# Patient Record
Sex: Male | Born: 1985 | Race: White | Hispanic: No | Marital: Single
Health system: Southern US, Community
[De-identification: ages and names within clinical notes are randomized; demographics above are authoritative.]

---

## 2010-05-29 ENCOUNTER — Emergency Department: Payer: Self-pay | Admitting: Emergency Medicine

## 2010-07-09 ENCOUNTER — Emergency Department: Payer: Self-pay | Admitting: Emergency Medicine

## 2010-07-13 ENCOUNTER — Emergency Department: Payer: Self-pay | Admitting: Emergency Medicine

## 2010-08-06 ENCOUNTER — Emergency Department: Payer: Self-pay | Admitting: Emergency Medicine

## 2010-08-12 ENCOUNTER — Emergency Department: Payer: Self-pay | Admitting: Emergency Medicine

## 2010-08-23 ENCOUNTER — Emergency Department: Payer: Self-pay | Admitting: Emergency Medicine

## 2010-09-01 ENCOUNTER — Emergency Department: Payer: Self-pay | Admitting: Emergency Medicine

## 2010-09-13 ENCOUNTER — Emergency Department: Payer: Self-pay | Admitting: Unknown Physician Specialty

## 2010-10-07 ENCOUNTER — Emergency Department: Payer: Self-pay | Admitting: Emergency Medicine

## 2010-10-14 ENCOUNTER — Emergency Department: Payer: Self-pay | Admitting: Emergency Medicine

## 2010-10-21 ENCOUNTER — Inpatient Hospital Stay: Payer: Self-pay | Admitting: Psychiatry

## 2010-11-21 ENCOUNTER — Emergency Department: Payer: Self-pay

## 2010-12-20 ENCOUNTER — Inpatient Hospital Stay: Payer: Self-pay | Admitting: Psychiatry

## 2011-01-16 ENCOUNTER — Inpatient Hospital Stay: Payer: Self-pay | Admitting: Psychiatry

## 2011-02-16 ENCOUNTER — Emergency Department: Payer: Self-pay | Admitting: Emergency Medicine

## 2011-02-18 ENCOUNTER — Inpatient Hospital Stay: Payer: Self-pay | Admitting: Psychiatry

## 2011-02-21 ENCOUNTER — Emergency Department: Payer: Self-pay | Admitting: Emergency Medicine

## 2011-08-16 ENCOUNTER — Emergency Department: Payer: Self-pay | Admitting: Emergency Medicine

## 2011-10-08 ENCOUNTER — Emergency Department: Payer: Self-pay | Admitting: Emergency Medicine

## 2011-10-12 ENCOUNTER — Inpatient Hospital Stay: Payer: Self-pay | Admitting: Psychiatry

## 2011-11-24 ENCOUNTER — Inpatient Hospital Stay: Payer: Self-pay | Admitting: Psychiatry

## 2011-11-24 LAB — COMPREHENSIVE METABOLIC PANEL
Albumin: 4.3 g/dL (ref 3.4–5.0)
Alkaline Phosphatase: 109 U/L (ref 50–136)
Anion Gap: 10 (ref 7–16)
BUN: 10 mg/dL (ref 7–18)
Chloride: 100 mmol/L (ref 98–107)
Co2: 28 mmol/L (ref 21–32)
Creatinine: 1.01 mg/dL (ref 0.60–1.30)
EGFR (African American): >60
EGFR (Non-African Amer.): >60
Glucose: 95 mg/dL (ref 65–99)
Potassium: 3.7 mmol/L (ref 3.5–5.1)
SGOT(AST): 39 U/L — ABNORMAL HIGH (ref 15–37)
SGPT (ALT): 46 U/L
Sodium: 138 mmol/L (ref 136–145)
Total Protein: 7.6 g/dL (ref 6.4–8.2)

## 2011-11-24 LAB — DRUG SCREEN, URINE
Barbiturates, Ur Screen: NEGATIVE (ref ?–200)
Benzodiazepine, Ur Scrn: NEGATIVE (ref ?–200)
Cannabinoid 50 Ng, Ur ~~LOC~~: NEGATIVE (ref ?–50)
MDMA (Ecstasy)Ur Screen: NEGATIVE (ref ?–500)
Methadone, Ur Screen: NEGATIVE (ref ?–300)
Phencyclidine (PCP) Ur S: NEGATIVE (ref ?–25)
Tricyclic, Ur Screen: NEGATIVE (ref ?–1000)

## 2011-11-24 LAB — CBC
HCT: 41.7 % (ref 40.0–52.0)
HGB: 14.2 g/dL (ref 13.0–18.0)
MCH: 31.3 pg (ref 26.0–34.0)
MCHC: 34 g/dL (ref 32.0–36.0)
MCV: 92 fL (ref 80–100)
Platelet: 259 10*3/uL (ref 150–440)
RBC: 4.52 10*6/uL (ref 4.40–5.90)
RDW: 13.1 % (ref 11.5–14.5)
WBC: 9.5 10*3/uL (ref 3.8–10.6)

## 2011-11-24 LAB — TSH: Thyroid Stimulating Horm: 3.28 u[IU]/mL

## 2011-11-24 LAB — ETHANOL
Ethanol %: <0.003 % (ref 0.000–0.080)
Ethanol: <3 mg/dL

## 2011-11-24 LAB — SALICYLATE LEVEL: Salicylates, Serum: <1.7 mg/dL

## 2011-11-24 LAB — CARBAMAZEPINE LEVEL, TOTAL: Carbamazepine: 7.1 ug/mL (ref 4.0–12.0)

## 2012-01-22 ENCOUNTER — Emergency Department: Payer: Self-pay | Admitting: Emergency Medicine

## 2012-01-22 LAB — CBC
HCT: 42.5 % (ref 40.0–52.0)
MCHC: 33.8 g/dL (ref 32.0–36.0)
MCV: 92 fL (ref 80–100)
RDW: 13.1 % (ref 11.5–14.5)

## 2012-01-22 LAB — COMPREHENSIVE METABOLIC PANEL
Anion Gap: 14 (ref 7–16)
Calcium, Total: 9.1 mg/dL (ref 8.5–10.1)
Chloride: 98 mmol/L (ref 98–107)
Co2: 23 mmol/L (ref 21–32)
Creatinine: 1.18 mg/dL (ref 0.60–1.30)
EGFR (African American): >60
EGFR (Non-African Amer.): >60
Potassium: 3.4 mmol/L — ABNORMAL LOW (ref 3.5–5.1)
SGOT(AST): 41 U/L — ABNORMAL HIGH (ref 15–37)
Sodium: 135 mmol/L — ABNORMAL LOW (ref 136–145)
Total Protein: 7.8 g/dL (ref 6.4–8.2)

## 2012-01-22 LAB — ACETAMINOPHEN LEVEL: Acetaminophen: <2 ug/mL

## 2012-01-22 LAB — DRUG SCREEN, URINE
Barbiturates, Ur Screen: NEGATIVE (ref ?–200)
Cannabinoid 50 Ng, Ur ~~LOC~~: NEGATIVE (ref ?–50)
Methadone, Ur Screen: NEGATIVE (ref ?–300)
Opiate, Ur Screen: NEGATIVE (ref ?–300)
Phencyclidine (PCP) Ur S: NEGATIVE (ref ?–25)
Tricyclic, Ur Screen: NEGATIVE (ref ?–1000)

## 2012-03-12 LAB — CBC
MCH: 31.1 pg (ref 26.0–34.0)
Platelet: 272 10*3/uL (ref 150–440)
RBC: 4.94 10*6/uL (ref 4.40–5.90)
WBC: 6.8 10*3/uL (ref 3.8–10.6)

## 2012-03-12 LAB — COMPREHENSIVE METABOLIC PANEL
Albumin: 4.2 g/dL (ref 3.4–5.0)
Calcium, Total: 9.4 mg/dL (ref 8.5–10.1)
Chloride: 104 mmol/L (ref 98–107)
Creatinine: 1.13 mg/dL (ref 0.60–1.30)
EGFR (African American): >60
Glucose: 101 mg/dL — ABNORMAL HIGH (ref 65–99)
Osmolality: 272 (ref 275–301)
SGOT(AST): 32 U/L (ref 15–37)
Sodium: 137 mmol/L (ref 136–145)
Total Protein: 8 g/dL (ref 6.4–8.2)

## 2012-03-12 LAB — DRUG SCREEN, URINE
Amphetamines, Ur Screen: NEGATIVE (ref ?–1000)
Barbiturates, Ur Screen: NEGATIVE (ref ?–200)
Benzodiazepine, Ur Scrn: NEGATIVE (ref ?–200)
Cannabinoid 50 Ng, Ur ~~LOC~~: NEGATIVE (ref ?–50)
MDMA (Ecstasy)Ur Screen: NEGATIVE (ref ?–500)
Methadone, Ur Screen: NEGATIVE (ref ?–300)
Opiate, Ur Screen: NEGATIVE (ref ?–300)
Phencyclidine (PCP) Ur S: NEGATIVE (ref ?–25)

## 2012-03-12 LAB — ETHANOL: Ethanol %: <0.003 % (ref 0.000–0.080)

## 2012-03-12 LAB — TSH: Thyroid Stimulating Horm: 0.492 u[IU]/mL

## 2012-03-12 LAB — ACETAMINOPHEN LEVEL: Acetaminophen: <2 ug/mL

## 2012-03-12 LAB — SALICYLATE LEVEL: Salicylates, Serum: <1.7 mg/dL

## 2012-03-14 ENCOUNTER — Inpatient Hospital Stay: Payer: Self-pay | Admitting: Psychiatry

## 2012-03-14 LAB — LITHIUM LEVEL: Lithium: 0.67 mmol/L

## 2012-03-17 LAB — LITHIUM LEVEL: Lithium: 0.82 mmol/L

## 2012-05-19 LAB — DRUG SCREEN, URINE

## 2012-05-19 LAB — CBC
HCT: 46.8 % (ref 40.0–52.0)
MCH: 30.7 pg (ref 26.0–34.0)
MCV: 91 fL (ref 80–100)
Platelet: 305 10*3/uL (ref 150–440)
RBC: 5.12 10*6/uL (ref 4.40–5.90)
RDW: 13.5 % (ref 11.5–14.5)
WBC: 7.4 10*3/uL (ref 3.8–10.6)

## 2012-05-20 ENCOUNTER — Inpatient Hospital Stay: Payer: Self-pay | Admitting: Psychiatry

## 2012-05-20 LAB — COMPREHENSIVE METABOLIC PANEL
Albumin: 4.7 g/dL (ref 3.4–5.0)
Alkaline Phosphatase: 193 U/L — ABNORMAL HIGH (ref 50–136)
Anion Gap: 9 (ref 7–16)
Calcium, Total: 9.7 mg/dL (ref 8.5–10.1)
Co2: 25 mmol/L (ref 21–32)
Creatinine: 1.23 mg/dL (ref 0.60–1.30)
EGFR (Non-African Amer.): >60
Glucose: 104 mg/dL — ABNORMAL HIGH (ref 65–99)
Osmolality: 274 (ref 275–301)
Potassium: 3.4 mmol/L — ABNORMAL LOW (ref 3.5–5.1)
SGOT(AST): 53 U/L — ABNORMAL HIGH (ref 15–37)
Sodium: 139 mmol/L (ref 136–145)

## 2012-05-20 LAB — CARBAMAZEPINE LEVEL, TOTAL: Carbamazepine: 4.4 ug/mL (ref 4.0–12.0)

## 2012-05-20 LAB — TSH: Thyroid Stimulating Horm: 1.38 u[IU]/mL

## 2012-05-20 LAB — LITHIUM LEVEL: Lithium: 1.15 mmol/L

## 2012-05-20 LAB — SALICYLATE LEVEL: Salicylates, Serum: <1.7 mg/dL

## 2012-05-21 LAB — HEPATIC FUNCTION PANEL A (ARMC)
Alkaline Phosphatase: 173 U/L — ABNORMAL HIGH (ref 50–136)
Bilirubin, Direct: <0.1 mg/dL (ref 0.00–0.20)
SGOT(AST): 43 U/L — ABNORMAL HIGH (ref 15–37)
SGPT (ALT): 76 U/L

## 2012-05-21 LAB — LIPID PANEL
Cholesterol: 168 mg/dL (ref 0–200)
HDL Cholesterol: 29 mg/dL — ABNORMAL LOW (ref 40–60)
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
## 2012-12-03 LAB — CBC
HCT: 44.9 % (ref 40.0–52.0)
HGB: 15.1 g/dL (ref 13.0–18.0)
MCH: 30.5 pg (ref 26.0–34.0)
MCV: 91 fL (ref 80–100)
Platelet: 277 10*3/uL (ref 150–440)

## 2012-12-03 LAB — DRUG SCREEN, URINE
Amphetamines, Ur Screen: NEGATIVE (ref ?–1000)
Benzodiazepine, Ur Scrn: NEGATIVE (ref ?–200)
Cannabinoid 50 Ng, Ur ~~LOC~~: NEGATIVE (ref ?–50)
Cocaine Metabolite,Ur ~~LOC~~: NEGATIVE (ref ?–300)
MDMA (Ecstasy)Ur Screen: NEGATIVE (ref ?–500)
Methadone, Ur Screen: NEGATIVE (ref ?–300)
Opiate, Ur Screen: NEGATIVE (ref ?–300)
Phencyclidine (PCP) Ur S: NEGATIVE (ref ?–25)

## 2012-12-03 LAB — ETHANOL
Ethanol %: <0.003 % (ref 0.000–0.080)
Ethanol: <3 mg/dL

## 2012-12-03 LAB — COMPREHENSIVE METABOLIC PANEL
Alkaline Phosphatase: 149 U/L — ABNORMAL HIGH (ref 50–136)
Chloride: 106 mmol/L (ref 98–107)
Creatinine: 0.86 mg/dL (ref 0.60–1.30)
EGFR (African American): >60
Glucose: 96 mg/dL (ref 65–99)
SGOT(AST): 22 U/L (ref 15–37)
SGPT (ALT): 34 U/L (ref 12–78)
Total Protein: 8 g/dL (ref 6.4–8.2)

## 2012-12-03 LAB — URINALYSIS, COMPLETE
Bilirubin,UR: NEGATIVE
Blood: NEGATIVE
Glucose,UR: NEGATIVE mg/dL (ref 0–75)
Ketone: NEGATIVE
Protein: NEGATIVE
RBC,UR: <1 /HPF (ref 0–5)
Squamous Epithelial: NONE SEEN
WBC UR: <1 /HPF (ref 0–5)

## 2012-12-04 ENCOUNTER — Inpatient Hospital Stay: Payer: Self-pay | Admitting: Psychiatry

## 2012-12-04 LAB — SALICYLATE LEVEL: Salicylates, Serum: <1.7 mg/dL

## 2012-12-04 LAB — ACETAMINOPHEN LEVEL: Acetaminophen: <2 ug/mL

## 2012-12-07 LAB — CARBAMAZEPINE LEVEL, TOTAL: Carbamazepine: 7.1 ug/mL (ref 4.0–12.0)

## 2012-12-07 LAB — LITHIUM LEVEL: Lithium: 0.77 mmol/L

## 2013-01-26 ENCOUNTER — Emergency Department: Payer: Self-pay | Admitting: Emergency Medicine

## 2013-01-26 LAB — DRUG SCREEN, URINE
Barbiturates, Ur Screen: NEGATIVE (ref ?–200)
Cannabinoid 50 Ng, Ur ~~LOC~~: NEGATIVE (ref ?–50)
Cocaine Metabolite,Ur ~~LOC~~: NEGATIVE (ref ?–300)
MDMA (Ecstasy)Ur Screen: NEGATIVE (ref ?–500)
Methadone, Ur Screen: NEGATIVE (ref ?–300)
Opiate, Ur Screen: NEGATIVE (ref ?–300)

## 2013-01-26 LAB — CBC
HCT: 48.2 % (ref 40.0–52.0)
MCH: 30.5 pg (ref 26.0–34.0)
MCHC: 33.3 g/dL (ref 32.0–36.0)
MCV: 91 fL (ref 80–100)
Platelet: 280 10*3/uL (ref 150–440)

## 2013-01-26 LAB — ACETAMINOPHEN LEVEL: Acetaminophen: <2 ug/mL

## 2013-01-26 LAB — URINALYSIS, COMPLETE
Bacteria: NONE SEEN
Leukocyte Esterase: NEGATIVE
Ph: 9 (ref 4.5–8.0)
Protein: NEGATIVE
Specific Gravity: 1.008 (ref 1.003–1.030)
WBC UR: <1 /HPF (ref 0–5)

## 2013-01-26 LAB — COMPREHENSIVE METABOLIC PANEL
Albumin: 4.5 g/dL (ref 3.4–5.0)
Calcium, Total: 9.4 mg/dL (ref 8.5–10.1)
Creatinine: 0.88 mg/dL (ref 0.60–1.30)
EGFR (African American): >60
Glucose: 104 mg/dL — ABNORMAL HIGH (ref 65–99)
Osmolality: 275 (ref 275–301)
SGPT (ALT): 49 U/L (ref 12–78)
Sodium: 138 mmol/L (ref 136–145)
Total Protein: 8.3 g/dL — ABNORMAL HIGH (ref 6.4–8.2)

## 2013-01-26 LAB — SALICYLATE LEVEL: Salicylates, Serum: <1.7 mg/dL

## 2013-01-26 LAB — TSH: Thyroid Stimulating Horm: 0.934 u[IU]/mL

## 2013-01-28 ENCOUNTER — Emergency Department: Payer: Self-pay | Admitting: Emergency Medicine

## 2013-01-28 LAB — URINALYSIS, COMPLETE
Bacteria: NONE SEEN
Bilirubin,UR: NEGATIVE
Blood: NEGATIVE
Glucose,UR: NEGATIVE mg/dL (ref 0–75)
Ketone: NEGATIVE
Leukocyte Esterase: NEGATIVE
Ph: 9 (ref 4.5–8.0)
Protein: NEGATIVE
Specific Gravity: 1.003 (ref 1.003–1.030)
WBC UR: NONE SEEN /HPF (ref 0–5)

## 2013-04-06 IMAGING — CR DG CHEST 2V
1 series · 3 of 3 positions shown · non-contrast
Comparison: none

REASON FOR EXAM: chest pain
COMMENTS:

PROCEDURE:     DXR - DXR CHEST PA (OR AP) AND LATERAL  - February 21, 2011  [DATE]
RESULT:     The lungs are clear. The cardiac silhouette and visualized bony
skeleton are unremarkable.

[Series 1: view not recorded · 0.17mm/px · 3 of 3 slices shown]
[im 1/3]
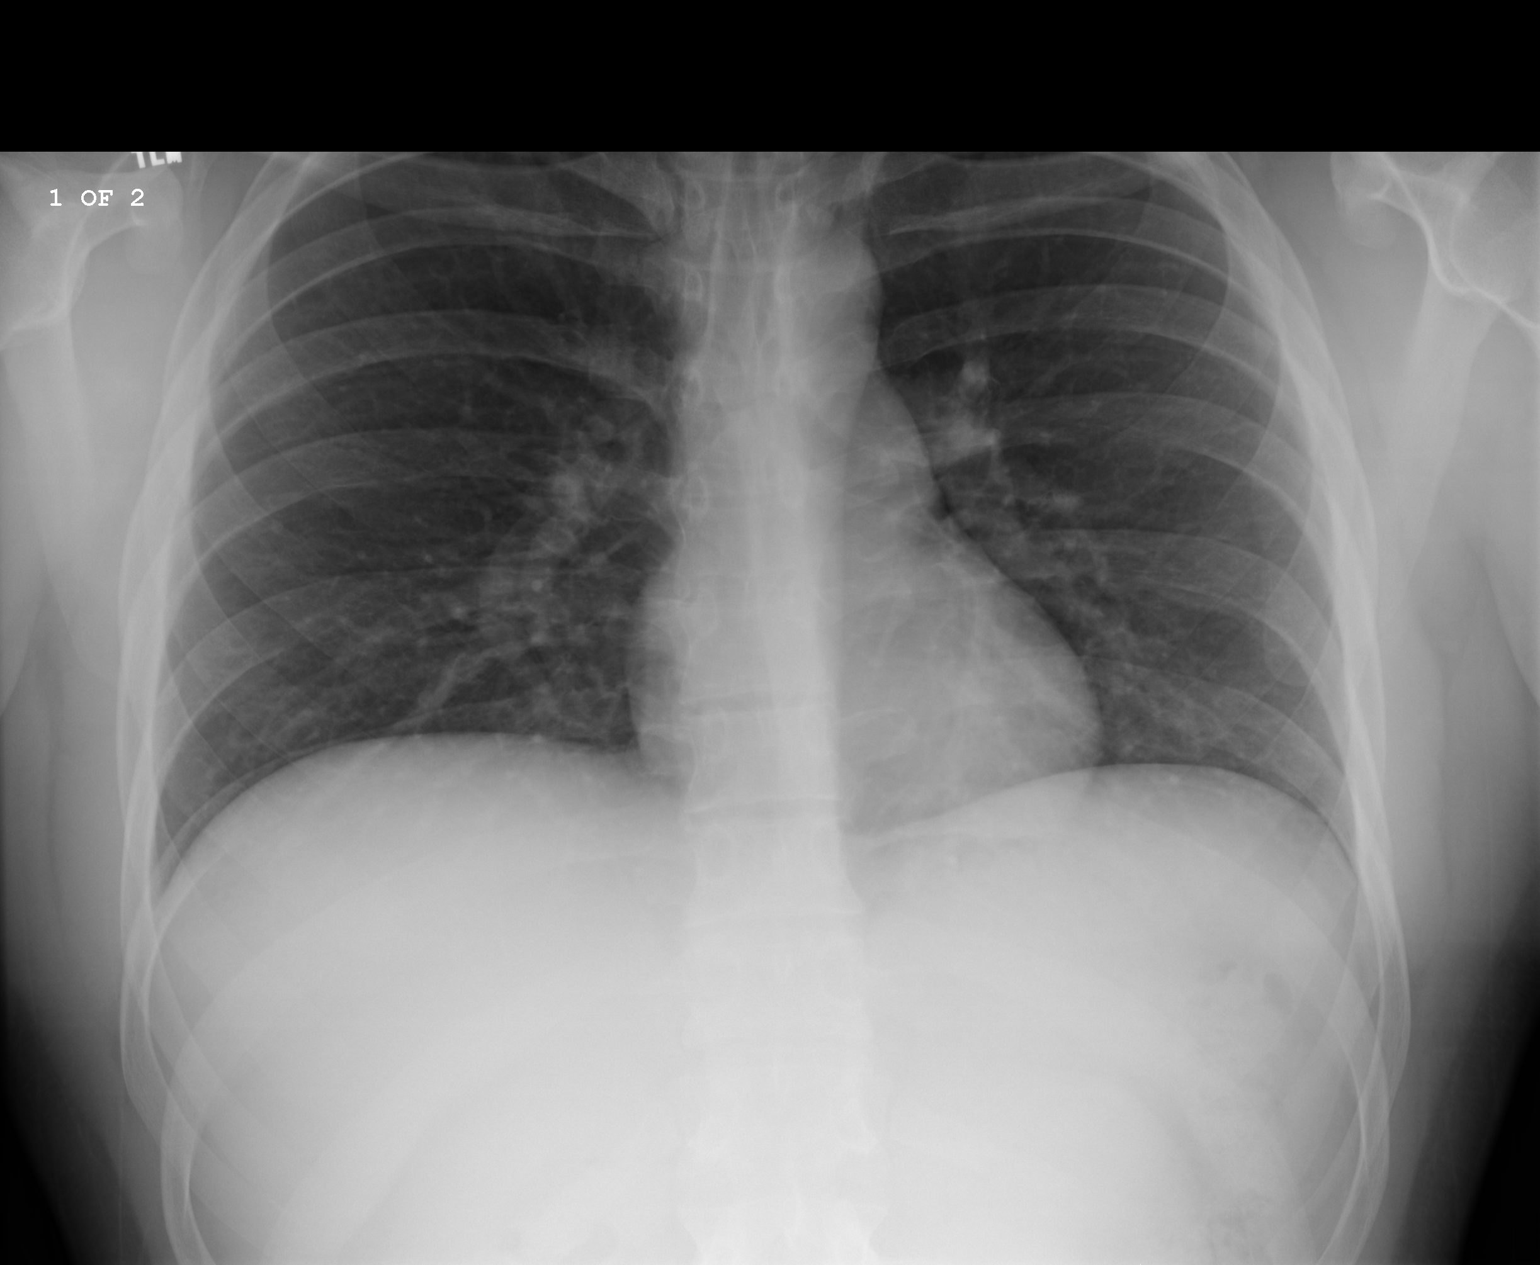
[im 2/3]
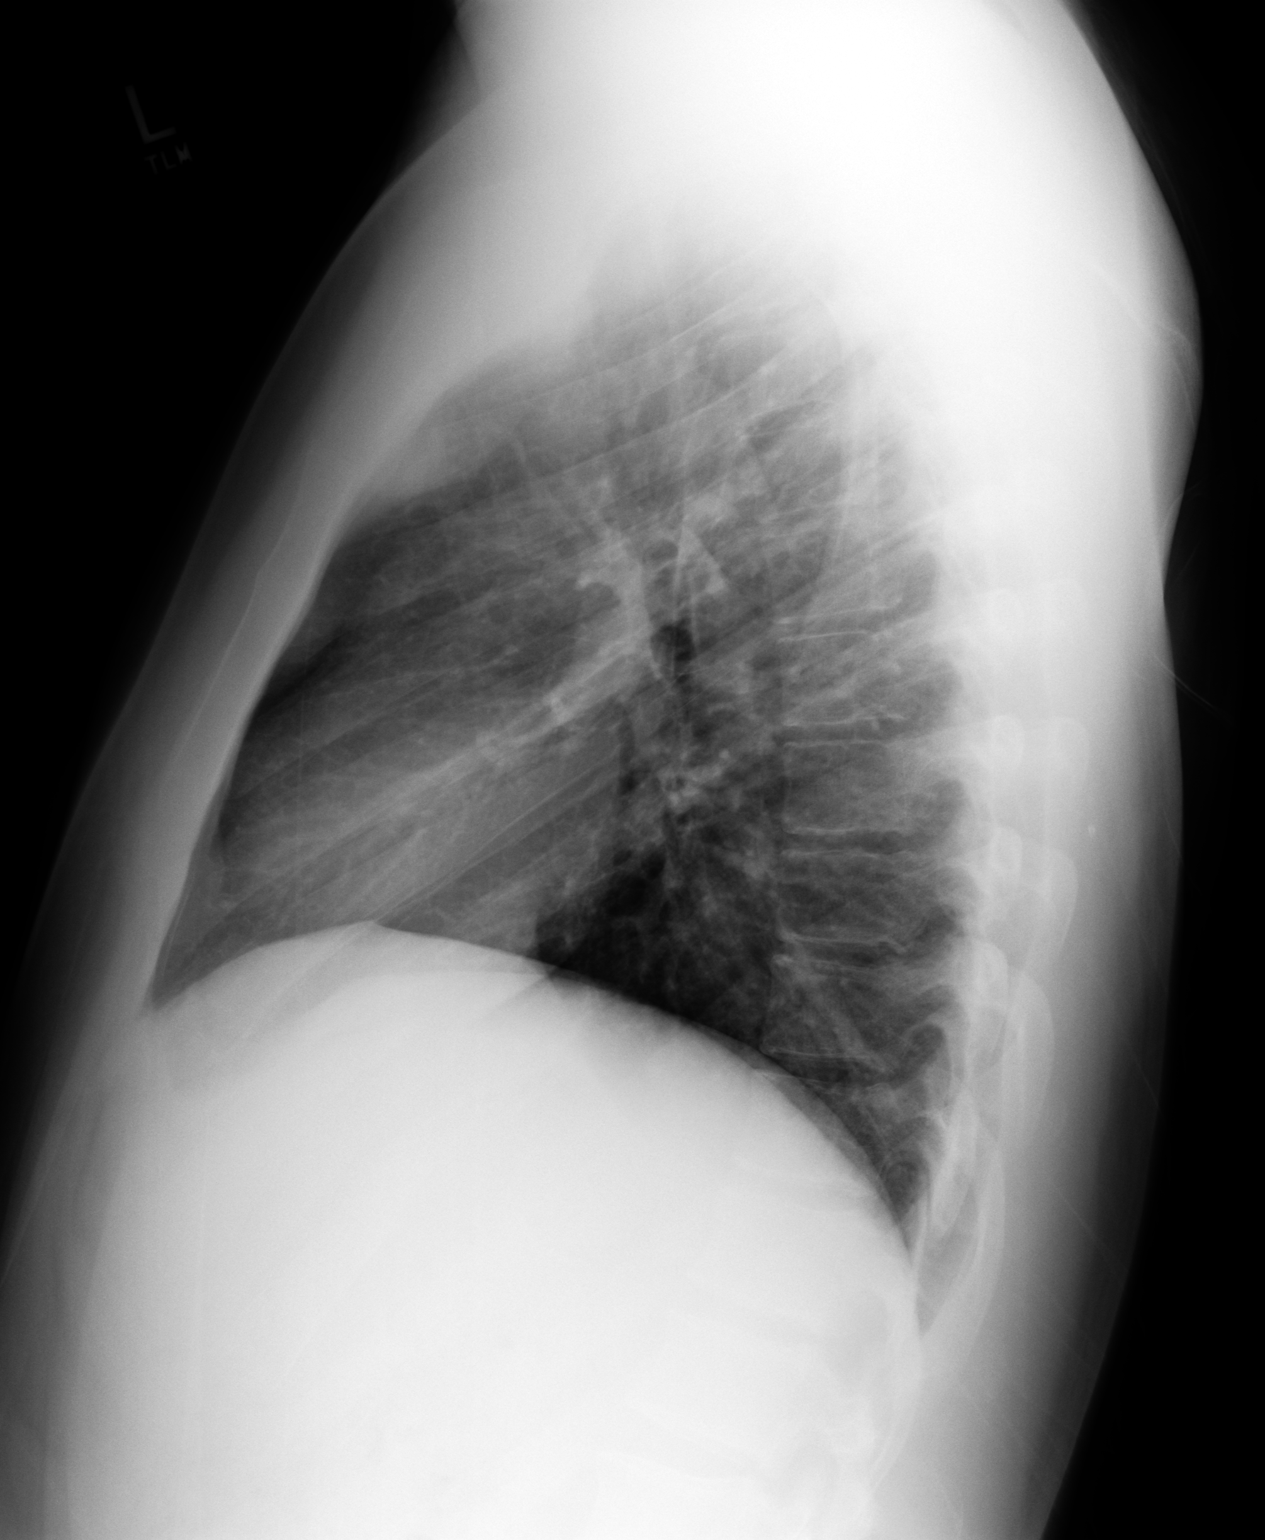
[im 3/3]
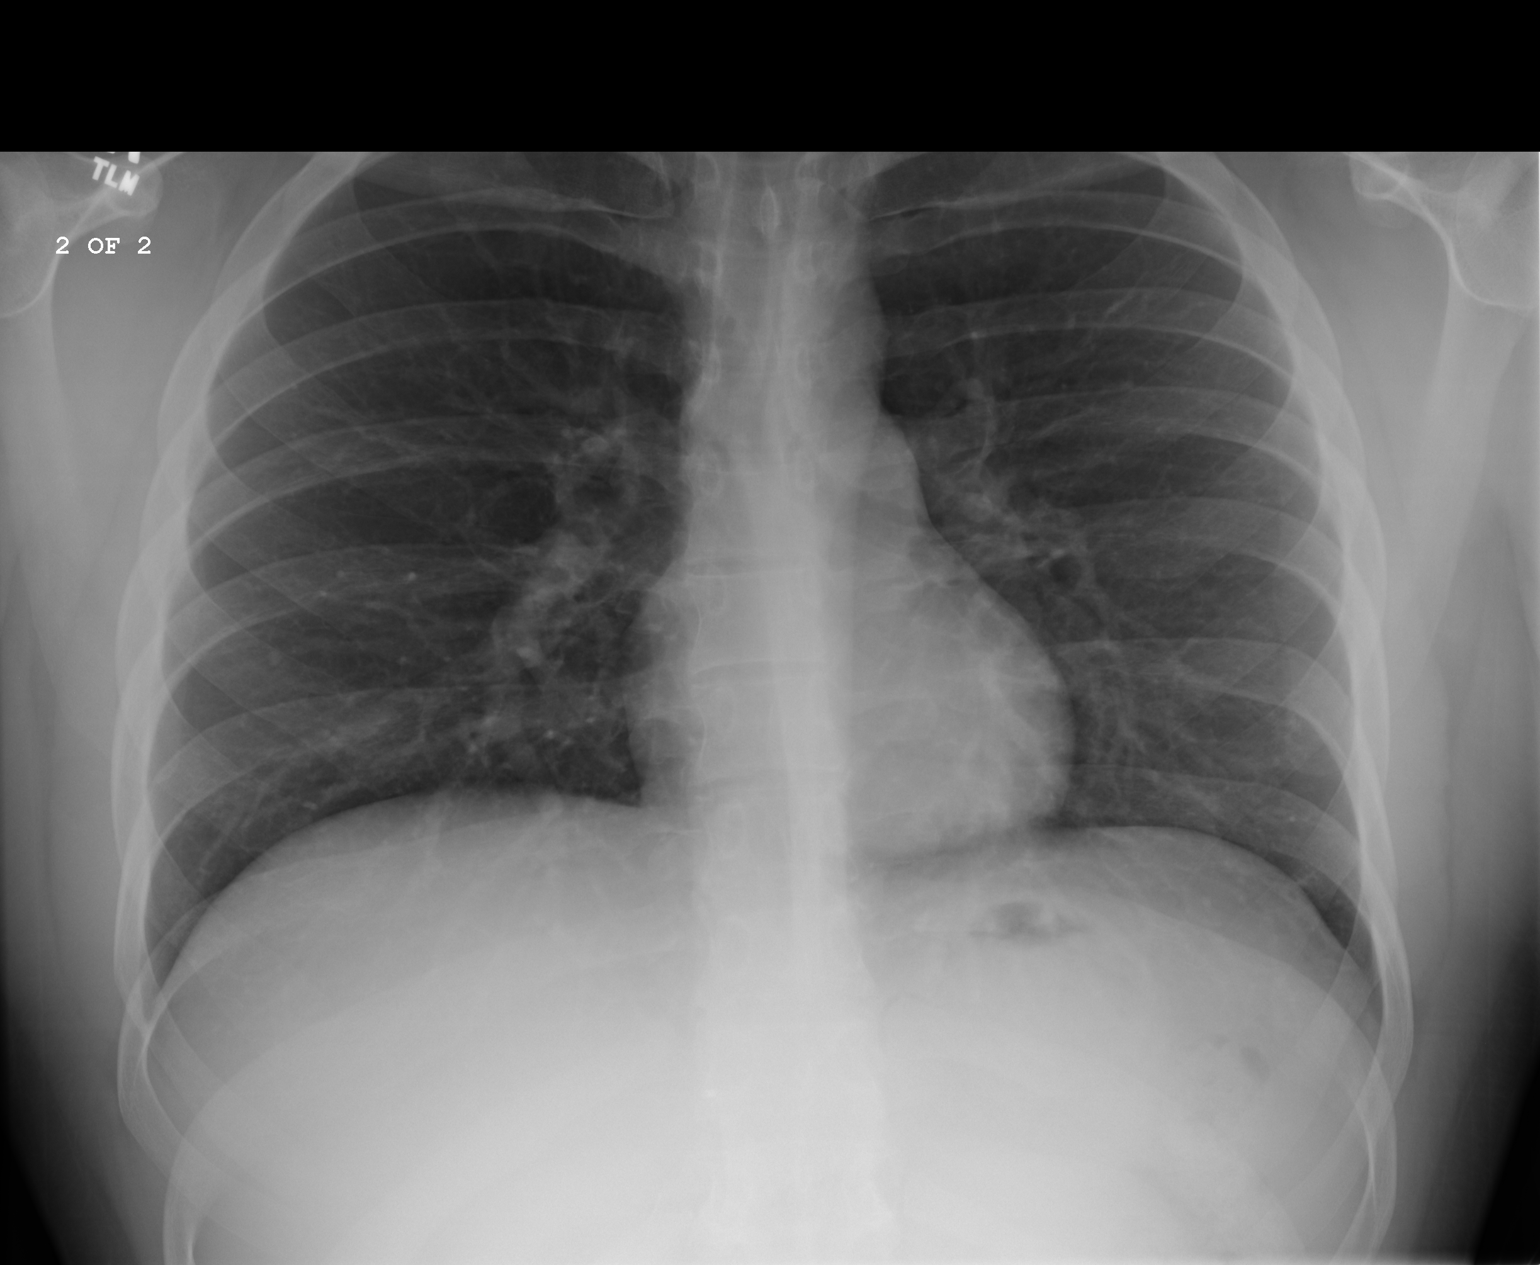

[3 of 3 positions shown; findings below may reference images not displayed]

IMPRESSION: 1. Chest radiograph without evidence of acute cardiopulmonary disease.

## 2013-04-15 LAB — URINALYSIS, COMPLETE
Bacteria: NONE SEEN
Bilirubin,UR: NEGATIVE
Glucose,UR: NEGATIVE mg/dL (ref 0–75)
Leukocyte Esterase: NEGATIVE
Ph: 7 (ref 4.5–8.0)
Specific Gravity: 1.002 (ref 1.003–1.030)

## 2013-04-15 LAB — DRUG SCREEN, URINE
Amphetamines, Ur Screen: NEGATIVE (ref ?–1000)
Benzodiazepine, Ur Scrn: NEGATIVE (ref ?–200)
Cannabinoid 50 Ng, Ur ~~LOC~~: NEGATIVE (ref ?–50)
MDMA (Ecstasy)Ur Screen: NEGATIVE (ref ?–500)
Opiate, Ur Screen: NEGATIVE (ref ?–300)
Tricyclic, Ur Screen: NEGATIVE (ref ?–1000)

## 2013-04-15 LAB — COMPREHENSIVE METABOLIC PANEL
Albumin: 4.1 g/dL (ref 3.4–5.0)
Alkaline Phosphatase: 120 U/L (ref 50–136)
Bilirubin,Total: 0.3 mg/dL (ref 0.2–1.0)
Calcium, Total: 9.2 mg/dL (ref 8.5–10.1)
Co2: 24 mmol/L (ref 21–32)
Creatinine: 0.98 mg/dL (ref 0.60–1.30)
EGFR (Non-African Amer.): >60
Glucose: 94 mg/dL (ref 65–99)
Potassium: 3.6 mmol/L (ref 3.5–5.1)
SGOT(AST): 35 U/L (ref 15–37)
SGPT (ALT): 60 U/L (ref 12–78)
Sodium: 137 mmol/L (ref 136–145)
Total Protein: 7.7 g/dL (ref 6.4–8.2)

## 2013-04-15 LAB — CBC
HCT: 42.4 % (ref 40.0–52.0)
HGB: 14.5 g/dL (ref 13.0–18.0)
MCH: 30.9 pg (ref 26.0–34.0)
MCHC: 34.2 g/dL (ref 32.0–36.0)
MCV: 90 fL (ref 80–100)
RBC: 4.7 10*6/uL (ref 4.40–5.90)
RDW: 13.7 % (ref 11.5–14.5)
WBC: 10.3 10*3/uL (ref 3.8–10.6)

## 2013-04-15 LAB — ETHANOL: Ethanol: <3 mg/dL

## 2013-04-16 ENCOUNTER — Inpatient Hospital Stay: Payer: Self-pay | Admitting: Psychiatry

## 2013-04-21 LAB — CARBAMAZEPINE LEVEL, TOTAL: Carbamazepine: 5.2 ug/mL (ref 4.0–12.0)

## 2015-03-09 NOTE — Discharge Summary (Signed)
PATIENT NAME:  [name], [name] MR#:  [id number] DATE OF BIRTH:  [birth year]  DATE OF ADMISSION:  12/04/2012 DATE OF DISCHARGE:  12/07/2012   HOSPITAL COURSE: See dictated history and physical for details of admission. This 29 year old man with a long history of schizoaffective disorder came to the hospital reporting vaguely that he was having suicidal and homicidal ideation. He felt like his mood was more labile. The reasons he gave me were fairly nonspecific. He said that he was feeling bad about not having much family support. In the hospital, he was continued on his usual medications as prescribed and was involved in groups and activities. The patient was only intermittently cooperative with groups, but did take his medicine. He did not display any violent, dangerous or self-injuring behavior. At the time of discharge, he has reported for a full day that he is having no thoughts at all about hurting himself. He is completely agreeable to continuing on his medication. Completely agreeable to continuing with outpatient treatment as prescribed in the community. His thoughts are fairly lucid. Not talking actively about any bizarre content. Not showing any racing thoughts or bizarre agitated behavior. The patient has been educated about the importance of staying compliant with his medication, talking to his outpatient providers regularly about his mood to give them feedback about how he is feeling and keeping on a regular sleep schedule. He is agreeable to all of that. He has been sleeping much better since coming into the hospital.   DISPOSITION: The patient is discharged back to [facility], his group home, and will follow up with [facility].   LABORATORY RESULTS: Admission labs showed a negative drug screen. TSH normal at 0.82. Alcohol undetectable. Chemistry showed just a slightly elevated alkaline phosphatase at 149. CBC was entirely normal. Urinalysis unremarkable. Salicylates and  acetaminophen negative. Levels of lithium and Tegretol were not drawn at admission. I am going to check those before he is discharged.   DISCHARGE MEDICATIONS:  1. Docusate 200 mg b.i.d.  2. Seroquel 800 mg at bedtime.  3. Prilosec 20 mg in the morning.  4. Mevacor 10 mg at night.  5. Ativan 1 mg t.i.d. p.r.n. for anxiety.  6. Lithium 600 mg 3 times a day.  7. Tegretol extended release 200 mg twice a day.  8. Cogentin 2 mg twice a day.   MENTAL STATUS EXAM AT DISCHARGE: Slightly disheveled young man who looks his stated age, who was cooperative with the interview. Good eye contact, normal psychomotor activity. Not fidgety or agitated. Speech is normal in rate, tone and volume. Affect smiling, upbeat, optimistic. Mood stated as being good. Thoughts are lucid with no obvious loosening of associations or bizarre content. Denies auditory or visual hallucinations. Denies suicidal or homicidal ideation. Shows good insight and judgment. Baseline intelligence normal, alert and oriented x4.   DIAGNOSIS PRINCIPLE AND PRIMARY:  AXIS I: Schizoaffective, bipolar type.   SECONDARY DIAGNOSES:  AXIS I: No further.  AXIS II: Deferred.  AXIS III: Dyslipidemia, gastric reflux symptoms, looks like he is losing some teeth which might be related to his lithium.  AXIS IV: Moderate to severe, chronic, from lack of family support.  AXIS V: Functioning at time of discharge 57.    ____________________________ Audery AmelJohn T. Clapacs, MD jtc:OSi D: 12/07/2012 11:42:58 ET T: 12/07/2012 12:40:02 ET JOB#: [id number]  cc: Audery AmelJohn T. Clapacs, MD, <Dictator> Audery AmelJOHN T CLAPACS MD ELECTRONICALLY SIGNED 12/09/2012 11:51
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
## 2015-03-09 NOTE — H&P (Signed)
PATIENT NAME:  [name], [name] MR#:  [id number] DATE OF BIRTH:  1986/07/06  DATE OF ADMISSION:  12/04/2012  SEX:  Male RACE:  White AGE:  29 year  INITIAL ASSESSMENT AND PSYCHIATRIC EVALUATION   IDENTIFYING INFORMATION: This is a 29 year old Hispanic male who is not employed, never worked and never married and has been living at group homes all his life and currently lives at [facility] group home.    HISTORY OF PRESENT ILLNESS: The patient [facility] after being discharged in July 2013 with a chief complaint of "last night I felt stressed out and started having homicidal ideas, trying to hurt others".  According to information obtained from the chart, the patient had been having suicidal and homicidal ideas last night and so he was brought to the hospital.  The patient reports he was stressed out because he had a conflict with the manager of the group home recently and this has been in his mind.  PAST PSYCHIATRIC HISTORY:  The patient has a long history of mental illness with multiple inpatient stays on psychiatry.  The patient reports that he has been in and out of group homes all of his life.  He had multiple inpatient admissions [facility] in the past.  He had been an inpatient at various other psychiatric facilities.  No history of previous suicide attempts though he had suicide ideas.  [facility] by Dr. Mervyn SkeetersA. Last appointment was in December 2013, next appointment will be made soon.  The patient was discharged on the following medications in July 2013:   1.  Lorazepam 1 mg 3 times a day. 2.  Lithium 100 mg  2 tablets 3 times a day. 3.  Carbamazepine 200 mg twice a day. 4.  Asenapine 20 mg once a day. 5.  Lovastatin 10 mg once a day. 6.  Docusate sodium 100 mg twice a day. 7.  Quetiapine extended-release 800 mg once a day. 8.  Cogentin 2 mg twice a day. 9.  Ibuprofen 600 mg p.o. q.6 hours p.r.n. 10.  Invega Sustenna 234  mg IM.   The patient is taking all of the medications as prescribed.  FAMILY HISTORY OF MENTAL ILLNESS:  The patient was adopted so no known history of suicide in the family.  He is in touch with his adopted parents.   ALCOHOL AND DRUG USE:  The patient does report a history of alcohol dependence between the ages of [age] and [age].  Currently, he drinks rarely on occasions.  He denies drinking alcohol on a regular basis at this time. He denies any street or prescription drug abuse.  He does admit to smoking, 1 to 2 packs a day for many years.    PAST MEDICAL HISTORY:  Hyperlipidemia and GERD.  No known history of high blood pressure no known history of diabetes mellitus.  No major surgeries.  No major injuries.  ALLERGIES:  No known drug allergies.  He is being followed by Dr. Marguerita MerlesJadla.  His last appointment was 2 weeks ago.  His next appointment is coming up in February 2014.    MILITARY HISTORY:  None.    WORK HISTORY:  Never.  PERSONAL HISTORY:  The patient was adopted.  He did not graduate from high school.  He dropped out in 10th grade.  He did not want to go back to the school.  No GED.  Never worked so far.  Never married.  dated  quite a few girls.  No children.  PHYSICAL EXAMINATION: VITAL SIGNS:  Temperature 98.2, pulse 80 per minute and regular, respirations 18, blood pressure 120/78 mmHg. HEENT: Head is normocephalic, atraumatic. Eyes PERRLA. Fundi bilaterally benign. EOMs visualized. Tympanic membranes have no exudate.  NECK: Supple without any organomegaly, lymphadenopathy or thyromegaly.  CHEST: Normal expansion. Normal breath sounds. HEART: Normal S1 and S2 without any murmurs or gallops. ABDOMEN: Soft. No organomegaly. Bowel sounds heard.  RECTAL: Deferred. NEUROLOGIC: Gait is nm.  Romberg is negative. Cranial nerves II through XII are grossly intact. DTRs are 2+. Plantars have normal response.  MENTAL STATUS EXAMINATION:  The patient is dressed in street clothes.  He is fully  alert and oriented to place, person and time.  He knew it was Saturday and he knew the date.  Speech was soft and slow and fluent and coherent.  Denies feeling depressed, denies feeling hopeless or helpless.  He realizes that he had suicidal and homicidal thoughts at the time of admission because he had conflicts with the manager and he was upset about the same.  Denies auditory or visual hallucinations.  Denies paranoid or suspicious ideas. He could 2 of the 3 recall of all the things we asked him to remember.  He could name the current president and the previous president.  He had difficulty with spelling and he said he always had difficulty with spelling.  He could not do serial 7s but he could do serial 3s with help.  On abstracting, the patient was concrete.  He does admit to appetite and sleep disturbance.   He denies any ideas or plans to hurt himself or others and he said "not now, I did probably when I came in".  Insight and judgment are guarded.   IMPRESSION:  AXIS I:    Bipolar disorder with history of psychotic features and manic episodes, last episode                  depressed.                    History of alcohol abuse/dependence in partial remission.                  Nicotine dependence. AXIS II:   Deferred. AXIS III:  Obesity.                   Hyperlipidemia.                   Chronic constipation.                   History of gastroesophageal reflux disease. AXIS IV:  Severe - long history of mental illness and had been at various group homes                   since childhood. AXIS V:   GAF at the time of admission 25.  PLAN: The patient is admitted to [facility] for close observation, management and help. He will be started back on all of his medications. During the stay in the hospital, he will be given milieu therapy and supportive counseling.  Coping skills in dealing with anger issues and getting along with other people will be discussed.  At the time of  discharge, he will be stabilised  and appropriate followup appointment will be made in the community.   ____________________________ Jannet Mantis. Guss Bunde, MD skc:ct D: 12/04/2012 19:07:18 ET T: 12/05/2012  07:23:36 ET JOB#: [id number]  cc: Trevis Eden K. Guss Bunde, MD, <Dictator> Beau Fanny MD ELECTRONICALLY SIGNED 12/05/2012 17:58
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
## 2015-03-11 NOTE — Consult Note (Signed)
Brief Consult Note: Diagnosis: bipolar disorder.   Patient was seen by consultant.   Consult note dictated.   Discussed with Attending MD.   Comments: Psychiatry: Patient seen. 29 yo well known from prior admits with hx bipolar disorder. Last night became agitated and loud and threatening at group home. Today he is calmed down and lucid and denies any suicidal or homicidal ideation. Denies any psychosis. Appears to be at his baseline mental state. Patient is on a great deal of medication already and it is not clear that adding anything is going to acutely change anything. Spoke with group home administrater who is ok with return home and he has outpatient follow up arranged.  Electronic Signatures: Audery Amellapacs, John T (MD)  (Signed 08-Mar-13 12:36)  Authored: Brief Consult Note   Last Updated: 08-Mar-13 12:36 by Audery Amellapacs, John T (MD)

## 2015-03-11 NOTE — Consult Note (Signed)
PATIENT NAME:  [name], [name] MR#:  [id number] DATE OF BIRTH:  01-17-1986  DATE OF CONSULTATION:  01/23/2012  REFERRING PHYSICIAN:   CONSULTING PHYSICIAN:  Audery AmelJohn T. Clapacs, MD  IDENTIFYING INFORMATION AND REASON FOR CONSULT: 29 year old man with a history of bipolar disorder brought in to the Emergency Room from his group home because of agitated, threatening behavior. Consult for evaluation of appropriate treatment and disposition.   HISTORY OF PRESENT ILLNESS: Information obtained from the patient and the chart and from speaking with the group home administrator. Patient and the group home administrator agree that yesterday evening he became acutely agitated. He started yelling and waving his arms around. Shoved some belongings off of a table. Was cursing. Appeared to be threatening to others. Group home felt like he was acutely dangerous and needed to call the police. Patient tells me that he got angry yesterday after going to church. One reason he gives for it is that he consumed eight cups of coffee at church although the group home manager thinks that there have been episodes like this in the past without excessive caffeine. Patient tells me that overall he does not notice any acute new stressors to his life. He did say that he was thinking about hurting "women". When I asked him today he totally denied any intention or wish to harm anyone particularly any women. Denied any suicidal ideation. He denies any current or recent hallucinations or paranoia or other psychotic symptoms. He says that there are things that get on his nerves regularly at the home that he is living in. He has been compliant with his medication. Recently there has been a change in his outpatient psychiatry provider. Something about his Medicaid being from the wrong county is requiring him to switch to a new provider and that has not been finalized yet.   PAST PSYCHIATRIC HISTORY: Long history of schizoaffective or bipolar disorder.  Multiple admissions in the past. Patient is known to me from prior admissions. Recently it appears that he has had episodes that are similar to this. He will become acutely agitated and lose his temper at his group home. It is not always clear what it is about. Usually without any particular change to his medication he calms down quickly in the hospital. He tends to respond very well to any show of force and definite limit setting. He tends to be a little harder to control and my experience when there aren't clear limits set with him. He has been on a great many medications in the past without anyone or even combination clearly making a complete difference. Current list of medicines includes three different antipsychotics and two separate mood stabilizers as well as tranquilizers. It appears that he has gotten a shot and has been compliant with all of this. [name] does have a past history of some aggression previously and has a past history of some suicidal behavior not in the immediately recent past.   PAST MEDICAL HISTORY: Patient has elevated cholesterol. He has gained some weight recently. Otherwise no significant ongoing medical problems.   SOCIAL HISTORY: Patient is disabled, lives in group homes. As mentioned previously, he tends to respond best to clear limits and scheduling and having things to do with his time. He has had some difficult relations with his family in the past. I believe at times the family seemed to be more disruptive to his emotional state. He is invested in his church attendance and likes to be social and have friends.  CURRENT MEDICATIONS:  1. Seroquel 800 mg at night.  2. Trazodone 200 mg at night.  3. Saphris 20 mg at night.  4. Lithium citrate 900 mg twice a day.  5. Ativan 1 mg 3 times a day p.r.n. which apparently is now being given pretty much on a standing basis. 6. Hinda Glatter Sustenna 234 mg every few weeks. 7. Lovastatin 10 mg at night.  8. Tegretol 200 mg twice a  day. 9. Cogentin 2 mg at night.   REVIEW OF SYSTEMS: Patient currently states that his mood is okay. Denies any suicidal or homicidal ideation. Denies any psychotic symptoms. Denies feeling anxious. Does not have any specific physical complaints.   MENTAL STATUS EXAM: Patient interviewed in a hospital room. He was cooperative and appropriate for the most part. Eye contact was adequate. Psychomotor activity normal. Speech normal rate, tone, and volume. Affect a little constricted. Mood stated as okay. Thoughts simplistic and a little bit slow, although he is not mentally retarded. Not disorganized or bizarre. Denies paranoia. Denies any hallucinations or delusions. He denies any suicidal or homicidal ideation. He has been calm and cooperative with treatment since being here in the Emergency Room. Patient states that he knows that he was out of line to lose his temper yesterday and regrets that and is going to work on trying to do better when he goes home.   ASSESSMENT: 29 year old gentleman with schizoaffective or bipolar disorder. In my experience with him I have also suspected there may be some degree of developmental disorder and/or personality disorder with him. Medications have been only partially helpful for him despite being on a very large amount of medication now. Patient lost his temper yesterday and was agitated but did not actually hurt anyone. Today he has calmed down to his normal mental state. Not agitated, hostile or threatening. At this point there does not seem to be any clear indication for change in his medication and he does not appear to be in acute danger to himself or others.   TREATMENT PLAN: I recommend that he be released back to his group home. I have talked with her group home manager and they are agreeable to that. No change to his medicine. We will make sure that we have appointments set up for follow up outpatient treatment for him in the near future. Patient agrees to this as  well. Supportive therapy and counseling done with the patient particularly emphasizing the importance of his working on controlling his temper, finding alternate ways to deal with his stress.   DIAGNOSIS PRINCIPLE AND PRIMARY:  AXIS I: Schizoaffective disorder, bipolar type.   SECONDARY DIAGNOSES:  AXIS I: No further.   AXIS II: Deferred.   AXIS III: Obesity, hyperlipidemia.   AXIS IV: Chronic severe stress from his chronic mental illness, living in group homes, limited social contact.   AXIS V: Functioning at time of discharge and evaluation 52.  ____________________________ Audery Amel, MD jtc:cms D: 01/23/2012 12:46:28 ET T: 01/23/2012 13:09:15 ET JOB#: [id number]  cc: Audery Amel, MD, <Dictator> Audery Amel MD ELECTRONICALLY SIGNED 01/23/2012 14:05

## 2015-03-11 NOTE — H&P (Signed)
PATIENT NAME:  [name], Audel MR#:  [id number] DATE OF BIRTH:  1986/05/21  DATE OF ADMISSION:  03/13/2012  INITIAL ASSESSMENT AND PSYCHIATRIC EVALUATION  IDENTIFYING INFORMATION: [name] is a 29 year old Hispanic male who has been living at a group home [facility]. Has been living there for one year. Patient was brought to the Emergency Room at [facility] because of behavioral problems.   CHIEF COMPLAINT: "I told my roommate I am going to suffocate him to death."  HISTORY OF PRESENT ILLNESS: Patient has been living at the same group home for one year. Patient reports the group home is full and there is no other place for him except that he has to share the same room with a roommate. He got upset and got into argument and he told that he was going to suffocate the roommate to death and he reports that he felt that he meant it at that time but not now.   PAST PSYCHIATRIC HISTORY: Patient has several inpatient hospitalizations in psychiatry. He has been in the hospital in psychiatry since age 89. He is one of the most difficult patients at [facility]. He reports that he has been taking his medications and being compliant with the medications. He was last discharged from [facility] on 11/26/2011. He was on following medications: Saphris 20 mg at bedtime, Cogentin 2 mg daily, Tegretol 200 mg b.i.d., Lithium 600 mg t.i.d., Ativan 1 mg p.o. t.i.d. p.r.n. as needed for agitation, Seroquel SR 800 mg at bedtime, Gean [place]ch[place]vega Sustenna injection 234 mg IM once a month, lovastatin 10 mg at bedtime, Colace 100 mg p.o. b.i.d. Patient reports he is compliant and takes his medications as prescribed.  FAMILY HISTORY OF MENTAL ILLNESS: Patient was adopted.   PSYCHIATRIST: Patient has appointment coming up this coming Tuesday with his psychiatrist from the group home.   SOCIAL HISTORY: Patient was adopted. [place] up in [place]. Has been in and out of group homes for many years. [place]  [place]. Has been in foster care and group homes since age 25 years. Dropped out in 9th grade because he kept stalking the girls and expelled from the school. No GED.   WORK HISTORY: Never worked so far.   MARRIAGES: Never married. No children.   ALCOHOL AND DRUGS: Denies street or prescription drug abuse or alcohol drinking and he reports "Not anymore. Not had alcohol or drugs in the past one year." Used to smoke cigarettes but he quit smoking three months ago.   PAST MEDICAL HISTORY: No known history of high blood pressure. No known diabetes mellitus. No major surgeries. No major injuries. No history of motor vehicle accident, never been unconscious. History of constipation. History of dyslipidemia.   ALLERGIES: No known drug allergies.  PRIMARY CARE PHYSICIAN: Has appointment coming up with the medical doctor soon.   PHYSICAL EXAMINATION:  VITAL SIGNS: Blood pressure 140/86 mmHg, pulse 78 per minute regular, respirations 18 per minute regular, temperature afebrile.  HEENT: Head is normocephalic, atraumatic. Pupils are equal, round, and reactive to light and accommodation. Fundi bilaterally benign.   NECK: Supple without any organomegaly, thyromegaly.   LUNGS: Clear to auscultation. No dullness to percussion.   HEART: Normal S1, S2 without any murmurs or gallops.  ABDOMEN: Soft. No organomegaly. Bowel sounds heard.   MUSCULOSKELETAL: Normal strength in all extremities.   SKIN: No rashes or bruises.   LYMPHATIC: No cervical lymphadenopathy.   RECTAL: Deferred.   NEUROLOGICAL: Gait is  normal. Romberg is negative. Cranial nerves II through XII grossly intact and normal. DTRs 2+. Plantars are normal response.  MENTAL STATUS EXAMINATION: Patient is dressed in street clothes. Alert and oriented and fully aware of situation that brought him for admission to the hospital. He is calm, pleasant and cooperative with the undersigned. He denies feeling depressed.  Denies feeling hopeless or helpless. Denies auditory or visual hallucinations. Does not appear to be responding to any internal stimuli. He reports that he did mean to suffocate the roommate to death at that time but not anymore. He feels stable enough and he is ready to go back. He did have command hallucinations in the past to hurt people but is not hearing any voices now. Cognition is grossly intact. Insight and judgement are guarded.   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    AXIS II: Deferred.  AXIS III:  1. Obesity, mild. 2. Dyslipidemia.  3. History of constipation.  AXIS IV: Severe. Long history of mental illness, problems with primary support.   AXIS V: Global Assessment of Functioning at admission 25.  PLAN: Patient admitted to [facility] for close observation, evaluation and help. He will be started back on all of the above named medications. During the stay in the hospital he will be given milieu therapy and supportive counseling. He will take part in individual and group therapy when he is ready to do so. Constant redirection for his behavior needs to be done where ever he lives because of his behavioral issues and problems. Patient will be discharged back to same group home after he is stabilized.   ____________________________ Jannet Mantis. Guss Bunde, MD skc:cms D: 03/14/2012 18:42:00 ET T: 03/15/2012 06:16:48 ET  JOB#: [id number] cc: Monika Salk K. Guss Bunde, MD, <Dictator> Beau Fanny MD ELECTRONICALLY SIGNED 03/20/2012 18:31

## 2015-03-11 NOTE — Consult Note (Signed)
Brief Consult Note: Diagnosis: Schizoaffective disorder bipolar type.   Patient was seen by consultant.   Consult note dictated.   Recommend further assessment or treatment.   Orders entered.   Discussed with Attending MD.   Comments: [name] became increasingly psychotic and insomniac in spite of treatment compliance. He is not suicidal or homicidal. There are no command auditory hallucinations.  The patient relocated to a new group home lately. He missed his psychiatry appointment last Thursday. He reportedly was given [place]vega sustenna shot at the end of December.  PLAN: 1. Psychosis: He is to continue [place]vega sustenna injections, next on January 23. He will continue on Seroquel and Saphris. We will switch extended release Seroquel to immediate release to facilitate sleep. He will continue on Cogentin 2 mg in am for EPS.  2.Mood: He is to continue Lithium and Tegretol. Both levels are therapeutic.  3. Insomnia: Trazodone was discontinued at his last appointment. Wil restart Trazodone.  4. Agitation: He should be offered as needed Ativan at the group home as prescribed by his psychiatrist. He is negative for benzodiazepines.   5. He will [facility]  Electronic Signatures: Kristine LineaPucilowska, Theresia Pree (MD)  (Signed 07-Jan-13 13:19)  Authored: Brief Consult Note   Last Updated: 07-Jan-13 13:19 by Kristine LineaPucilowska, Keera Altidor (MD)

## 2015-03-11 NOTE — H&P (Signed)
PATIENT NAME:  [name], [name] MR#:  [id number] DATE OF BIRTH:  05-24-1986  DATE OF ADMISSION:  11/24/2011  REFERRING PHYSICIAN: Dr. Beaulah Dinning.   ATTENDING PHYSICIAN: Orson Slick, M.D.   IDENTIFYING DATA: [name] is a 29 year old male with history of bipolar illness.   CHIEF COMPLAINT: I hear voices.   HISTORY OF PRESENT ILLNESS: [name] has been hospitalized several times at [facility] since he came to our area in 2011. His last hospitalization was in November 2012. He has been stable on his medications of lithium, Tegretol, Seroquel, Saphris and Invega. He takes medications with enthusiasm and in spite of good treatment compliance, he experiences periods of hypomania that are not well controlled with the current regimen in spite of multiple mood stabilizers at adequate doses. He reports that for the past 2 to 3 weeks, he felt restless. It usually manifests itself with excessive walking. He has been walking in the neighborhood for hours, but he did not find it calming. He thinks that what precipitated the current admission was a stressful situation at church. He participated in some youth group meetings. They were talking about pornography and child pornography specifically. [name] reportedly disclosed to his group leader that he was in prison for molesting a minor. I am not really certain if this is true. As far as we know, he has no criminal record. As a result, [name] was told that he will not be able to participate in any activities involving children in church. He was asked if it was okay with him and he said yes, but when returned home he was unsettled, very angry and started hallucinating. His psychosis oftentimes has sexual undertone. Sometimes he thinks of women from church. I never heard him mentioning children. Sexual thoughts are rather upsetting to him and he decided to come to the hospital. He is not suicidal or homicidal. I do not think that he wants to  act out, but we decided to admit him to the hospital, especially that he works with new Printmaker. This is because his group home was bought and sold. Also, he will no longer work with a Designer, jewellery at [employer] and he met his new psychiatrist there, Dr. [name]. He, however, did miss an appointment with him four or five days ago on Thursday. Apparently, new group owner was unaware. During his last visit with Dr. [name] his trazodone was discontinued. It is hard to say whether it contributed to insomnia, but the patient experiences profound insomnia, racing thoughts, psychotic symptoms and hyperactivity. He is on so many different medications that it is obvious that his providers question the need for them all. Of course, it is hard to answer this question, but I remember when we admitted him the first time, his major reason for injectable Kirt Boys was the fact that he was known to disappear from group homes for days on end during his frequent hypomanic episodes and, therefore, was without medication for stretches of time. He never opposes taking medicines and really wants to get better, therefore, it is very easy to prescribe medication for him. I do believe that there is a benefit of this polypharmacotherapy. He, however, did gain a lot of weight in the past year and a half, but he is much calmer and when he comes to the hospital, he is very close to his baseline so it is easy to discharge him home. The best evidence is that the patient stayed at the same group  home since April of 2012 while before he was changing his locations very, very frequently. [name] denies any substance use or alcohol.   PAST PSYCHIATRIC HISTORY: He has had multiple psychiatric hospitalizations since the age of [age]. He is a very difficult patient. He apparently has been stable on five mood stabilizers including lithium, Tegretol, Seroquel, [place] and Saphris. There is no history of suicide attempts. There is  no history of violence or substance use. When upset and psychotic he sometimes threatens to kill people, but he has never been aggressive towards anyone.   FAMILY PSYCHIATRIC HISTORY: Unknown. The patient was adopted.   PAST MEDICAL HISTORY:  1. Constipation.  2. Dyslipidemia.   ALLERGIES: No known drug allergies.   MEDICATIONS ON ADMISSION:  1. Saphris 20 mg at bedtime. 2. Cogentin 2 mg daily.  3. Tegretol 200 mg twice daily.  4. Lithium 600 mg 3 times daily.  5. Ativan 1 mg 3 times daily as needed for agitation. 6. Seroquel SR 800 mg at bedtime.  7. Trazodone was discontinued. 8. [place] injection 234 mg IM once monthly, last injection given on 12/26. 9. Lovastatin 10 mg at bedtime.  10. Colace 100 mg twice daily.   SOCIAL HISTORY: He is adopted, grew up in [place]. His first group home was in [place]. Every so often he wants to return to this area. This would be closer for his adoptive parents to visit him there, especially that they are getting old and their health is failing. He has been in foster care and group homes since the age of [age]. He is disabled and gets Medicaid. He does not have a case manager at the moment or community support as he is switching his providers.   REVIEW OF SYSTEMS: CONSTITUTIONAL: No fevers or chills. Positive for gradual weight gain. EYES: No double or blurred vision. ENT: No hearing loss. RESPIRATORY: No shortness of breath or cough. CARDIOVASCULAR: No chest pain or orthopnea. GASTROINTESTINAL: No abdominal pain, nausea, vomiting, or diarrhea. Positive for constipation. GU: No incontinence or frequency. ENDOCRINE: No heat or cold intolerance. LYMPHATIC: No anemia or easy bruising. INTEGUMENTARY: No acne or rash. MUSCULOSKELETAL: No muscle or joint pain. NEUROLOGIC: No tingling or weakness. PSYCHIATRIC: See history of present illness for details.   PHYSICAL EXAMINATION:  VITAL SIGNS: Blood pressure 151/95, pulse 80, respirations  20, no temperature recorded.   GENERAL: This is an obese male in no acute distress.   HEENT: The pupils are equal, round, and reactive to light. Sclera anicteric.   NECK: Supple. No thyromegaly.   LUNGS: Clear to auscultation. No dullness to percussion.   HEART: Regular rhythm and rate. No murmurs, rubs, or gallops.   ABDOMEN: Soft, nontender, nondistended. Positive bowel sounds.   MUSCULOSKELETAL: Normal muscle strength in all extremities.   SKIN: No rashes or bruises.   LYMPHATIC: No cervical adenopathy.   NEUROLOGIC: Cranial nerves II through XII are intact. Normal gait.   LABORATORY, DIAGNOSTIC, AND RADIOLOGICAL DATA: Chemistries are within normal limits. Blood alcohol level 0. LFTs within normal limits except for AST of 39. TSH 3.28. Lithium level 0.75. Tegretol level 7.1. Urine tox screen negative for substances. CBC within normal limits. Serum acetaminophen and salicylates are low.   MENTAL STATUS EXAMINATION ON ADMISSION: He is alert and oriented to person, place, time, and situation. He is pleasant, polite, and cooperative. He maintains good eye contact. He is wearing hospital scrubs and a yellow shirt. He recognizes me from previous admissions. His speech is  of normal rhythm, rate, and volume, rather soft. His mood is anxious with full affect. Thought processing is logical and goal oriented with its own logic. Thought content: He denies suicidal or homicidal ideations. He is somewhat delusional and paranoid. The idea of being a child predator, I have no information that it is true. We will certainly try to obtain collateral information. He endorses auditory hallucinations commanding him to hurt people at church. His cognition is difficult to assess today. His insight and judgment are fair.   SUICIDE RISK ASSESSMENT ON ADMISSION: This is a patient with a long history of mood instability and psychosis. No suicide history. The patient has been compliant with treatment, but  decompensated under stress.   ASSESSMENT:  AXIS I:  1. Bipolar affective disorder. 2. Hypomania with psychosis.   AXIS II: Deferred.   AXIS III:  1. Obesity. 2. Dyslipidemia. 3. Constipation.   AXIS IV: Mental illness, primary support, change of providers.   AXIS V: GAF on admission 25.   PLAN: The patient was admitted to [facility] unit for safety, stabilization and medication management. He was initially placed on suicide precautions and was closely monitored for any unsafe behaviors. He underwent full psychiatric and risk assessment. He received pharmacotherapy, individual and group psychotherapy, substance abuse counseling, and support from therapeutic milieu. 1. Violent behavior. There is no history. The patient adamantly denies any intention or plan to hurt himself or others. He is able to deal with his voices through exercise and listening to the music. He has been compliant with his medications. 2. Psychosis. He has been compliant with all of his meds including Seroquel, [place] and Saphris. It is hard to even think of another addition. If anything, one would try to substitute second-generation antipsychotics for something simple, like Haldol, Prolixin, Thorazine, Navane. It is not an easy decision and there is no evidence based information to help [place] with this decision.  3. Mood. The patient experiences periodic mood instability in spite of good treatment compliance. He has been sick since the age of [age]. All things considered, I think that he is doing pretty well. We will continue lithium and Tegretol. The levels are therapeutic. We could try to increase lithium, but if I remember correctly there is a history of lithium toxicity.  4. We will continue Zocor. Weight gain is a problem. I remember him being half the weight he currently carries.  5. Social. We will try to work with his new group home owner to assure doctor's visits  compliance. He needs to be with community support team and he certainly needs a case Freight forwarder.   DISPOSITION: He will be discharged to his old group home. He has a follow-up appointment already with Dr. [name] on Thursday; today is Monday.   ____________________________ Wardell Honour. Bary Leriche, MD jbp:ap D: 11/24/2011 20:19:20 ET T: 11/25/2011 07:02:36 ET JOB#: [id number]  cc: Bernarda Erck B. Bary Leriche, MD, <Dictator> Clovis Fredrickson MD ELECTRONICALLY SIGNED 11/26/2011 23:35
